# Patient Record
Sex: Male | Born: 1990 | Race: Black or African American | Hispanic: No | Marital: Single | Smoking: Never smoker
Health system: Southern US, Community
[De-identification: ages and names within clinical notes are randomized; demographics above are authoritative.]

## PROBLEM LIST (undated history)

## (undated) ENCOUNTER — Ambulatory Visit (HOSPITAL_COMMUNITY): Admission: EM

---
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
## 2010-03-10 ENCOUNTER — Emergency Department (HOSPITAL_COMMUNITY): Admission: EM | Admit: 2010-03-10 | Discharge: 2010-03-11 | Payer: Self-pay | Admitting: Emergency Medicine

## 2010-08-21 ENCOUNTER — Emergency Department (HOSPITAL_COMMUNITY)
Admission: EM | Admit: 2010-08-21 | Discharge: 2010-08-21 | Payer: Self-pay | Source: Home / Self Care | Admitting: Family Medicine

## 2011-03-13 ENCOUNTER — Emergency Department (HOSPITAL_COMMUNITY): Payer: Self-pay

## 2011-03-13 ENCOUNTER — Emergency Department (HOSPITAL_COMMUNITY)
Admission: EM | Admit: 2011-03-13 | Discharge: 2011-03-13 | Disposition: A | Discharge status: Home / Self Care | Payer: Self-pay | Attending: Emergency Medicine | Admitting: Emergency Medicine

## 2011-03-13 DIAGNOSIS — Y9239 Other specified sports and athletic area as the place of occurrence of the external cause: Secondary | ICD-10-CM | POA: Insufficient documentation

## 2011-03-13 DIAGNOSIS — S6390XA Sprain of unspecified part of unspecified wrist and hand, initial encounter: Secondary | ICD-10-CM | POA: Insufficient documentation

## 2011-03-13 DIAGNOSIS — Y9367 Activity, basketball: Secondary | ICD-10-CM | POA: Insufficient documentation

## 2011-03-13 DIAGNOSIS — M79609 Pain in unspecified limb: Secondary | ICD-10-CM | POA: Insufficient documentation

## 2011-03-13 DIAGNOSIS — Y92838 Other recreation area as the place of occurrence of the external cause: Secondary | ICD-10-CM | POA: Insufficient documentation

## 2011-03-13 DIAGNOSIS — IMO0002 Reserved for concepts with insufficient information to code with codable children: Secondary | ICD-10-CM | POA: Insufficient documentation

## 2012-09-23 ENCOUNTER — Emergency Department (HOSPITAL_COMMUNITY)
Admission: EM | Admit: 2012-09-23 | Discharge: 2012-09-23 | Disposition: A | Discharge status: Home / Self Care | Payer: Self-pay | Attending: Emergency Medicine | Admitting: Emergency Medicine

## 2012-09-23 ENCOUNTER — Encounter (HOSPITAL_COMMUNITY): Payer: Self-pay | Admitting: Emergency Medicine

## 2012-09-23 DIAGNOSIS — S335XXA Sprain of ligaments of lumbar spine, initial encounter: Secondary | ICD-10-CM | POA: Insufficient documentation

## 2012-09-23 DIAGNOSIS — X500XXA Overexertion from strenuous movement or load, initial encounter: Secondary | ICD-10-CM | POA: Insufficient documentation

## 2012-09-23 DIAGNOSIS — Y9239 Other specified sports and athletic area as the place of occurrence of the external cause: Secondary | ICD-10-CM | POA: Insufficient documentation

## 2012-09-23 DIAGNOSIS — Y9367 Activity, basketball: Secondary | ICD-10-CM | POA: Insufficient documentation

## 2012-09-23 LAB — URINALYSIS, ROUTINE W REFLEX MICROSCOPIC
Glucose, UA: NEGATIVE mg/dL
Hgb urine dipstick: NEGATIVE
Ketones, ur: NEGATIVE mg/dL
Nitrite: NEGATIVE
Protein, ur: 30 mg/dL — AB
Specific Gravity, Urine: 1.025 (ref 1.005–1.030)
pH: 6 (ref 5.0–8.0)

## 2012-09-23 LAB — URINE MICROSCOPIC-ADD ON

## 2012-09-23 MED ORDER — HYDROCODONE-ACETAMINOPHEN 5-325 MG PO TABS: 1.0000 | ORAL_TABLET | ORAL | Status: DC | PRN

## 2012-09-23 MED ORDER — IBUPROFEN 800 MG PO TABS: 800.0000 mg | ORAL_TABLET | Freq: Three times a day (TID) | ORAL | Status: DC

## 2012-09-23 MED ORDER — CYCLOBENZAPRINE HCL 5 MG PO TABS: 5.0000 mg | ORAL_TABLET | Freq: Two times a day (BID) | ORAL | Status: DC | PRN

## 2012-09-23 MED ORDER — IBUPROFEN 400 MG PO TABS
800.0000 mg | ORAL_TABLET | Freq: Once | ORAL | Status: AC
  Administered 2012-09-23: 800 mg via ORAL
  Filled 2012-09-23: qty 2

## 2012-09-23 NOTE — ED Notes (Signed)
Pt c/o pain in lower back describes as sharp.  Onset 1 hr ago.

## 2012-09-23 NOTE — ED Provider Notes (Deleted)
History     CSN: [id number]  Arrival date & time 09/23/12  0054   First MD Initiated Contact with Patient 09/23/12 0143      Chief Complaint  Patient presents with  . Back Pain    (Consider location/radiation/quality/duration/timing/severity/associated sxs/prior treatment) HPI  Pt prsents to the ED with complaints of Low back pain that does not radiate and is sharp in nature. It started 1 hour ago while playing basketball. He was shooting when the pain started. It is new. He denies hearing a pop or being unable to walk afterwards. He said that once he slowed down and rested the discomfort increased. NO bowel or urine incontinence. No numbness or weakness.    History reviewed. No pertinent past medical history.  History reviewed. No pertinent past surgical history.  No family history on file.  History  Substance Use Topics  . Smoking status: Never Smoker   . Smokeless tobacco: Not on file  . Alcohol Use: No      Review of Systems  Review of Systems  Gen: no weight loss, fevers, chills, night sweats  Eyes: no discharge or drainage, no occular pain or visual changes  Nose: no epistaxis or rhinorrhea  Mouth: no dental pain, no sore throat  Neck: no neck pain  Lungs:No wheezing, coughing or hemoptysis CV: no chest pain, palpitations, dependent edema or orthopnea  Abd: no abdominal pain, nausea, vomiting  GU: no dysuria or gross hematuria  MSK:  Low back pain Neuro: no headache, no focal neurologic deficits  Skin: no abnormalities Psyche: negative.   Allergies  Review of patient's allergies indicates no known allergies.  Home Medications   Current Outpatient Rx  Name  Route  Sig  Dispense  Refill  . ibuprofen (ADVIL,MOTRIN) 200 MG tablet   Oral   Take 200 mg by mouth every 6 (six) hours as needed for pain.         . cyclobenzaprine (FLEXERIL) 5 MG tablet   Oral   Take 1 tablet (5 mg total) by mouth 2 (two) times daily as needed for muscle spasms.   12  tablet   0   . HYDROcodone-acetaminophen (NORCO/VICODIN) 5-325 MG per tablet   Oral   Take 1 tablet by mouth every 4 (four) hours as needed for pain.   6 tablet   0   . ibuprofen (ADVIL,MOTRIN) 800 MG tablet   Oral   Take 1 tablet (800 mg total) by mouth 3 (three) times daily.   21 tablet   0     BP 119/72  Pulse 77  Temp(Src) 98.5 F (36.9 C) (Oral)  Resp 17  SpO2 98%  Physical Exam  Nursing note and vitals reviewed. Constitutional: He appears well-developed and well-nourished. No distress.  HENT:  Head: Normocephalic and atraumatic.  Eyes: Pupils are equal, round, and reactive to light.  Neck: Normal range of motion. Neck supple.  Cardiovascular: Normal rate and regular rhythm.   Pulmonary/Chest: Effort normal.  Abdominal: Soft.  Musculoskeletal:   Equal strength to bilateral lower extremities. Neurosensory  function adequate to both legs. Skin color is normal. Skin is warm and moist. I see no step off deformity, no bony tenderness. Pt is able to ambulate without limp. Pain is relieved when sitting in certain positions. ROM is decreased due to pain. No crepitus, laceration, effusion, swelling.  Pulses are normal   Neurological: He is alert.  Skin: Skin is warm and dry.    ED Course  Procedures (including critical care  time)  Labs Reviewed  URINALYSIS, ROUTINE W REFLEX MICROSCOPIC - Abnormal; Notable for the following:    Protein, ur 30 (*)    All other components within normal limits  URINE MICROSCOPIC-ADD ON - Abnormal; Notable for the following:    Casts HYALINE CASTS (*)    Crystals CA OXALATE CRYSTALS (*)    All other components within normal limits   No results found.   1. Low back strain       MDM   Patient with back pain. No neurological deficits. Patient is ambulatory. No warning symptoms of back pain including: loss of bowel or bladder control, night sweats, waking from sleep with back pain, unexplained fevers or weight loss, h/o cancer, IVDU,  recent trauma. No concern for cauda equina, epidural abscess, or other serious cause of back pain. Conservative measures such as rest, ice/heat and pain medicine indicated with PCP follow-up if no improvement with conservative management.           Dorthula Matas, PA 09/26/12 2029

## 2012-09-23 NOTE — ED Provider Notes (Signed)
Medical screening examination/treatment/procedure(s) were performed by non-physician practitioner and as supervising physician I was immediately available for consultation/collaboration.  Avrum Kimball Lytle Michaels, MD 09/23/12 [phone]

## 2012-09-23 NOTE — ED Provider Notes (Signed)
History     CSN: [id number]  Arrival date & time 09/23/12  0054   First MD Initiated Contact with Patient 09/23/12 0143      Chief Complaint  Patient presents with  . Back Pain    (Consider location/radiation/quality/duration/timing/severity/associated sxs/prior treatment) HPI  H. presents to the emergency department with complaints of right lower back pain. He does not have any radiation of this pain. The pain started 4 hours ago while he was playing basketball. He says that he was taken a shot when he immediately felt pain in his right low back. He denies IV drug use, loss of bowel or bladder function, numbness or weakness on that side or anywhere else. He also denies dysuria or midline back pain. Patient denies history of back pain. nad vss    History reviewed. No pertinent past medical history.  History reviewed. No pertinent past surgical history.  No family history on file.  History  Substance Use Topics  . Smoking status: Never Smoker   . Smokeless tobacco: Not on file  . Alcohol Use: No      Review of Systems  Review of Systems  Gen: no weight loss, fevers, chills, night sweats  Eyes: no discharge or drainage, no occular pain or visual changes  Nose: no epistaxis or rhinorrhea  Mouth: no dental pain, no sore throat  Neck: no neck pain  Lungs:No wheezing, coughing or hemoptysis CV: no chest pain, palpitations, dependent edema or orthopnea  Abd: no abdominal pain, nausea, vomiting  GU: no dysuria or gross hematuria  MSK: low back pain Neuro: no headache, no focal neurologic deficits  Skin: no abnormalities Psyche: negative.   Allergies  Review of patient's allergies indicates no known allergies.  Home Medications   Current Outpatient Rx  Name  Route  Sig  Dispense  Refill  . ibuprofen (ADVIL,MOTRIN) 200 MG tablet   Oral   Take 200 mg by mouth every 6 (six) hours as needed for pain.           BP 119/72  Pulse 77  Temp(Src) 98.5 F (36.9 C)  (Oral)  Resp 17  SpO2 98%  Physical Exam  Nursing note and vitals reviewed. Constitutional: He appears well-developed and well-nourished. No distress.  HENT:  Head: Normocephalic and atraumatic.  Eyes: Pupils are equal, round, and reactive to light.  Neck: Normal range of motion. Neck supple.  Cardiovascular: Normal rate and regular rhythm.   Pulmonary/Chest: Effort normal.  Abdominal: Soft.  Musculoskeletal:       Lumbar back: He exhibits tenderness, pain and spasm. He exhibits normal range of motion, no bony tenderness, no swelling, no edema, no deformity, no laceration and normal pulse.       Back:   Equal strength to bilateral lower extremities. Neurosensory  function adequate to both legs. Skin color is normal. Skin is warm and moist. I see no step off deformity, no bony tenderness. Pt is able to ambulate without limp. Pain is relieved when sitting in certain positions. ROM is decreased due to pain. No crepitus, laceration, effusion, swelling.  Pulses are normal   Neurological: He is alert.  Skin: Skin is warm and dry.    ED Course  Procedures (including critical care time)  Labs Reviewed  URINALYSIS, ROUTINE W REFLEX MICROSCOPIC - Abnormal; Notable for the following:    Protein, ur 30 (*)    All other components within normal limits  URINE MICROSCOPIC-ADD ON - Abnormal; Notable for the following:    Casts  HYALINE CASTS (*)    Crystals CA OXALATE CRYSTALS (*)    All other components within normal limits   No results found.   1. Low back strain       MDM  Patient with back pain. No neurological deficits. Patient is ambulatory. No warning symptoms of back pain including: loss of bowel or bladder control, night sweats, waking from sleep with back pain, unexplained fevers or weight loss, h/o cancer, IVDU, recent trauma. No concern for cauda equina, epidural abscess, or other serious cause of back pain. Conservative measures such as rest, ice/heat and pain medicine  indicated with PCP follow-up if no improvement with conservative management.           Dorthula Matas, PA 09/23/12 0236

## 2014-10-23 ENCOUNTER — Emergency Department (HOSPITAL_COMMUNITY)
Admission: EM | Admit: 2014-10-23 | Discharge: 2014-10-23 | Disposition: A | Discharge status: Home / Self Care | Payer: Self-pay | Attending: Emergency Medicine | Admitting: Emergency Medicine

## 2014-10-23 ENCOUNTER — Emergency Department (HOSPITAL_COMMUNITY): Payer: Self-pay

## 2014-10-23 ENCOUNTER — Encounter (HOSPITAL_COMMUNITY): Payer: Self-pay | Admitting: *Deleted

## 2014-10-23 DIAGNOSIS — R079 Chest pain, unspecified: Secondary | ICD-10-CM | POA: Insufficient documentation

## 2014-10-23 DIAGNOSIS — Z791 Long term (current) use of non-steroidal anti-inflammatories (NSAID): Secondary | ICD-10-CM | POA: Insufficient documentation

## 2014-10-23 DIAGNOSIS — R001 Bradycardia, unspecified: Secondary | ICD-10-CM | POA: Insufficient documentation

## 2014-10-23 LAB — CBC
HEMATOCRIT: 40.4 % (ref 39.0–52.0)
Hemoglobin: 13.4 g/dL (ref 13.0–17.0)
MCH: 28.9 pg (ref 26.0–34.0)
MCHC: 33.2 g/dL (ref 30.0–36.0)
MCV: 87.1 fL (ref 78.0–100.0)
PLATELETS: 202 10*3/uL (ref 150–400)
RBC: 4.64 MIL/uL (ref 4.22–5.81)
RDW: 13.1 % (ref 11.5–15.5)
WBC: 6.2 10*3/uL (ref 4.0–10.5)

## 2014-10-23 LAB — I-STAT TROPONIN, ED: Troponin i, poc: 0 ng/mL (ref 0.00–0.08)

## 2014-10-23 LAB — BASIC METABOLIC PANEL
ANION GAP: 5 (ref 5–15)
BUN: 16 mg/dL (ref 6–23)
CO2: 29 mmol/L (ref 19–32)
Calcium: 9.4 mg/dL (ref 8.4–10.5)
Chloride: 104 mmol/L (ref 96–112)
Creatinine, Ser: 1.2 mg/dL (ref 0.50–1.35)
GFR, EST NON AFRICAN AMERICAN: 83 mL/min — AB (ref >90)
GLUCOSE: 80 mg/dL (ref 70–99)
POTASSIUM: 4.5 mmol/L (ref 3.5–5.1)
SODIUM: 138 mmol/L (ref 135–145)

## 2014-10-23 MED ORDER — NAPROXEN 375 MG PO TABS: 375.0000 mg | ORAL_TABLET | Freq: Two times a day (BID) | ORAL | Status: DC

## 2014-10-23 MED ORDER — KETOROLAC TROMETHAMINE 30 MG/ML IJ SOLN
30.0000 mg | Freq: Once | INTRAMUSCULAR | Status: AC
  Administered 2014-10-23: 30 mg via INTRAMUSCULAR
  Filled 2014-10-23: qty 1

## 2014-10-23 NOTE — ED Provider Notes (Signed)
CSN: [id number]     Arrival date & time 10/23/14  2006 History   First MD Initiated Contact with Patient 10/23/14 2122     Chief Complaint  Patient presents with  . Chest Pain     (Consider location/radiation/quality/duration/timing/severity/associated sxs/prior Treatment) HPI Comments: Patient presents to the emergency department with chief complaint of chest pain. He states that he has had chest pain for the past 2 weeks. He states that it is constant. Patient states that it is worsened with certain positions. He reports that it increases with heavy lifting. He denies any associated shortness of breath, diaphoresis, or radiating symptoms. Denies any family history of sudden cardiac death. He does report extensive heart disease in his grandparents. He denies any new mechanism of injury.  The history is provided by the patient. No language interpreter was used.    History reviewed. No pertinent past medical history. History reviewed. No pertinent past surgical history. History reviewed. No pertinent family history. History  Substance Use Topics  . Smoking status: Never Smoker   . Smokeless tobacco: Not on file  . Alcohol Use: No    Review of Systems  Constitutional: Negative for fever and chills.  Respiratory: Negative for shortness of breath.   Cardiovascular: Positive for chest pain.  Gastrointestinal: Negative for nausea, vomiting, diarrhea and constipation.  Genitourinary: Negative for dysuria.  All other systems reviewed and are negative.     Allergies  Review of patient's allergies indicates no known allergies.  Home Medications   Prior to Admission medications   Medication Sig Start Date End Date Taking? Authorizing Provider  cyclobenzaprine (FLEXERIL) 5 MG tablet Take 1 tablet (5 mg total) by mouth 2 (two) times daily as needed for muscle spasms. Patient not taking: Reported on 10/23/2014 09/23/12   Marlon Peliffany Greene, PA-C  HYDROcodone-acetaminophen (NORCO/VICODIN)  5-325 MG per tablet Take 1 tablet by mouth every 4 (four) hours as needed for pain. Patient not taking: Reported on 10/23/2014 09/23/12   Marlon Peliffany Greene, PA-C  ibuprofen (ADVIL,MOTRIN) 800 MG tablet Take 1 tablet (800 mg total) by mouth 3 (three) times daily. Patient not taking: Reported on 10/23/2014 09/23/12   Marlon Peliffany Greene, PA-C   BP 116/56 mmHg  Pulse 58  Temp(Src) 98.7 F (37.1 C) (Oral)  Resp 20  Ht 5\' 9"  (1.753 m)  Wt 153 lb (69.4 kg)  BMI 22.58 kg/m2  SpO2 100% Physical Exam  Constitutional: He is oriented to person, place, and time. He appears well-developed and well-nourished.  HENT:  Head: Normocephalic and atraumatic.  Eyes: Conjunctivae and EOM are normal. Pupils are equal, round, and reactive to light. Right eye exhibits no discharge. Left eye exhibits no discharge. No scleral icterus.  Neck: Normal range of motion. Neck supple. No JVD present.  Cardiovascular: Regular rhythm and normal heart sounds.  Exam reveals no gallop and no friction rub.   No murmur heard. Mildly bradycardic  Pulmonary/Chest: Effort normal and breath sounds normal. No respiratory distress. He has no wheezes. He has no rales. He exhibits no tenderness.  Abdominal: Soft. He exhibits no distension and no mass. There is no tenderness. There is no rebound and no guarding.  Musculoskeletal: Normal range of motion. He exhibits no edema or tenderness.  Neurological: He is alert and oriented to person, place, and time.  Skin: Skin is warm and dry.  Psychiatric: He has a normal mood and affect. His behavior is normal. Judgment and thought content normal.  Nursing note and vitals reviewed.   ED Course  Procedures (including critical care time) Labs Review Labs Reviewed  CBC  BASIC METABOLIC PANEL  I-STAT TROPOININ, ED    Imaging Review Dg Chest 2 View  10/23/2014   CLINICAL DATA:  Subacute onset of chest pain, moving to the left posterior chest when lifting objects. Initial encounter.  EXAM: CHEST  2  VIEW  COMPARISON:  Thoracic spine radiographs performed 02/06/2009  FINDINGS: The lungs are well-aerated and clear. There is no evidence of focal opacification, pleural effusion or pneumothorax.  The heart is normal in size; the mediastinal contour is within normal limits. No acute osseous abnormalities are seen.  IMPRESSION: No acute cardiopulmonary process seen. No displaced rib fractures identified.   Electronically Signed   By: Roanna Raider M.D.   On: 10/23/2014 20:45     EKG Interpretation None     ED ECG REPORT  I personally interpreted this EKG   Date: 10/23/2014   Rate: 58  Rhythm: sinus bradycardia  QRS Axis: normal  Intervals: normal  ST/T Wave abnormalities: normal  Conduction Disutrbances:none  Narrative Interpretation:   Old EKG Reviewed: none available   MDM   Final diagnoses:  Chest pain, unspecified chest pain type    Patient with constant chest pain 2 weeks. Heart score is 1 for significant family history.  PERC negative.  Troponin is negative, EKG is unremarkable for ischemic changes.  Highly doubt ACS or PE.  CXR is negative for pneumothorax, negative widened mediastinum, no rib fractures, or pneumonia.  I suspect that the patient can be safely discharged to home and may continue workup on outpatient basis.    Roxy Horseman, PA-C 10/23/14 2207  Rolland Porter, MD 10/23/14 2233

## 2014-10-23 NOTE — Discharge Instructions (Signed)
Chest Pain (Nonspecific) °It is often hard to give a diagnosis for the cause of chest pain. There is always a chance that your pain could be related to something serious, such as a heart attack or a blood clot in the lungs. You need to follow up with your doctor. °HOME CARE °· If antibiotic medicine was given, take it as directed by your doctor. Finish the medicine even if you start to feel better. °· For the next few days, avoid activities that bring on chest pain. Continue physical activities as told by your doctor. °· Do not use any tobacco products. This includes cigarettes, chewing tobacco, and e-cigarettes. °· Avoid drinking alcohol. °· Only take medicine as told by your doctor. °· Follow your doctor's suggestions for more testing if your chest pain does not go away. °· Keep all doctor visits you made. °GET HELP IF: °· Your chest pain does not go away, even after treatment. °· You have a rash with blisters on your chest. °· You have a fever. °GET HELP RIGHT AWAY IF:  °· You have more pain or pain that spreads to your arm, neck, jaw, back, or belly (abdomen). °· You have shortness of breath. °· You cough more than usual or cough up blood. °· You have very bad back or belly pain. °· You feel sick to your stomach (nauseous) or throw up (vomit). °· You have very bad weakness. °· You pass out (faint). °· You have chills. °This is an emergency. Do not wait to see if the problems will go away. Call your local emergency services (911 in [place] Do not drive yourself to the hospital. °MAKE SURE YOU:  °· Understand these instructions. °· Will watch your condition. °· Will get help right away if you are not doing well or get worse. °Document Released: 01/08/2008 Document Revised: 07/27/2013 Document Reviewed: 01/08/2008 °[employer]® Patient Information ©2015 [employer]. This information is not intended to replace advice given to you by your health care provider. Make sure you discuss any questions you have with your  health care provider. ° °Chest Wall Pain °Chest wall pain is pain in or around the bones and muscles of your chest. It may take up to 6 weeks to get better. It may take longer if you must stay physically active in your work and activities.  °CAUSES  °Chest wall pain may happen on its own. However, it may be caused by: °· A viral illness like the flu. °· Injury. °· Coughing. °· Exercise. °· Arthritis. °· Fibromyalgia. °· Shingles. °HOME CARE INSTRUCTIONS  °· Avoid overtiring physical activity. Try not to strain or perform activities that cause pain. This includes any activities using your chest or your abdominal and side muscles, especially if heavy weights are used. °· Put ice on the sore area. °¨ Put ice in a plastic bag. °¨ Place a towel between your skin and the bag. °¨ Leave the ice on for 15-20 minutes per hour while awake for the first 2 days. °· Only take over-the-counter or prescription medicines for pain, discomfort, or fever as directed by your caregiver. °SEEK IMMEDIATE MEDICAL CARE IF:  °· Your pain increases, or you are very uncomfortable. °· You have a fever. °· Your chest pain becomes worse. °· You have new, unexplained symptoms. °· You have nausea or vomiting. °· You feel sweaty or lightheaded. °· You have a cough with phlegm (sputum), or you cough up blood. °MAKE SURE YOU:  °· Understand these instructions. °· Will watch your   condition. °· Will get help right away if you are not doing well or get worse. °Document Released: 07/22/2005 Document Revised: 10/14/2011 Document Reviewed: 03/18/2011 °[employer]® Patient Information ©2015 [employer]. This information is not intended to replace advice given to you by your health care provider. Make sure you discuss any questions you have with your health care provider. ° °

## 2014-10-23 NOTE — ED Notes (Signed)
Pt in c/o chest pain for the last two weeks, pressure is constant and certain positions and lifting things increase pain, denies other symptoms, no distress noted

## 2016-03-06 ENCOUNTER — Encounter (HOSPITAL_COMMUNITY): Payer: Self-pay | Admitting: Emergency Medicine

## 2016-03-06 ENCOUNTER — Emergency Department (HOSPITAL_COMMUNITY)

## 2016-03-06 ENCOUNTER — Emergency Department (HOSPITAL_COMMUNITY)
Admission: EM | Admit: 2016-03-06 | Discharge: 2016-03-06 | Disposition: A | Discharge status: Home / Self Care | Attending: Emergency Medicine | Admitting: Emergency Medicine

## 2016-03-06 DIAGNOSIS — Z791 Long term (current) use of non-steroidal anti-inflammatories (NSAID): Secondary | ICD-10-CM | POA: Diagnosis not present

## 2016-03-06 DIAGNOSIS — R590 Localized enlarged lymph nodes: Secondary | ICD-10-CM | POA: Insufficient documentation

## 2016-03-06 DIAGNOSIS — K219 Gastro-esophageal reflux disease without esophagitis: Secondary | ICD-10-CM

## 2016-03-06 DIAGNOSIS — R0789 Other chest pain: Secondary | ICD-10-CM | POA: Diagnosis present

## 2016-03-06 LAB — BASIC METABOLIC PANEL
Anion gap: 7 (ref 5–15)
BUN: 15 mg/dL (ref 6–20)
CHLORIDE: 101 mmol/L (ref 101–111)
CO2: 28 mmol/L (ref 22–32)
Calcium: 9.7 mg/dL (ref 8.9–10.3)
Creatinine, Ser: 1.32 mg/dL — ABNORMAL HIGH (ref 0.61–1.24)
GFR calc Af Amer: >60 mL/min (ref >60)
GFR calc non Af Amer: >60 mL/min (ref >60)
GLUCOSE: 90 mg/dL (ref 65–99)
POTASSIUM: 3.9 mmol/L (ref 3.5–5.1)
SODIUM: 136 mmol/L (ref 135–145)

## 2016-03-06 LAB — CBC
HEMATOCRIT: 42.9 % (ref 39.0–52.0)
Hemoglobin: 14.2 g/dL (ref 13.0–17.0)
MCH: 28.2 pg (ref 26.0–34.0)
MCHC: 33.1 g/dL (ref 30.0–36.0)
MCV: 85.3 fL (ref 78.0–100.0)
Platelets: 201 10*3/uL (ref 150–400)
RBC: 5.03 MIL/uL (ref 4.22–5.81)
RDW: 12.9 % (ref 11.5–15.5)
WBC: 6.4 10*3/uL (ref 4.0–10.5)

## 2016-03-06 LAB — I-STAT TROPONIN, ED: Troponin i, poc: 0 ng/mL (ref 0.00–0.08)

## 2016-03-06 MED ORDER — GI COCKTAIL ~~LOC~~
30.0000 mL | Freq: Once | ORAL | Status: AC
  Administered 2016-03-06: 30 mL via ORAL
  Filled 2016-03-06: qty 30

## 2016-03-06 MED ORDER — KETOROLAC TROMETHAMINE 60 MG/2ML IM SOLN
60.0000 mg | Freq: Once | INTRAMUSCULAR | Status: AC
  Administered 2016-03-06: 60 mg via INTRAMUSCULAR
  Filled 2016-03-06: qty 2

## 2016-03-06 MED ORDER — FAMOTIDINE 20 MG PO TABS: 20.0000 mg | ORAL_TABLET | Freq: Two times a day (BID) | ORAL | 0 refills | Status: DC

## 2016-03-06 NOTE — ED Triage Notes (Signed)
Pt states that he has had chest pain x 2 days. States that it is exacerbated by any movement. Denies hx of same. Tried otc acid reducers w/o relief. Alert and oriented.

## 2016-03-06 NOTE — ED Notes (Signed)
Provider d/c iv access

## 2016-03-06 NOTE — ED Provider Notes (Signed)
[facility]-EMERGENCY DEPT Provider Note   CSN: [id number] Arrival date & time: 03/06/16  0212  First Provider Contact:  None    By signing my name below, I, Emmanuella Mensah, attest that this documentation has been prepared under the direction and in the presence of Ingeborg Fite, MD. Electronically Signed: Angelene Giovanni, ED Scribe. 03/06/16. 3:38 AM.    History   Chief Complaint Chief Complaint  Patient presents with  . Chest Pain   HPI Comments: Justin Dougherty is a 25 y.o. male who presents to the Emergency Department complaining of gradually worsening constant pressure chest pain onset 2 days ago. He notes that his pain is worse at night as he lays down or when he bends over. He states that he has been drinking protein shakes lately. No alleviating factors noted. Pt has not tried any medications PTA. He reports that he smoked marijuana yesterday. No fever, chills, SOB, n/v, or leg swelling.  Pt also c/o an area of swelling to his left groin onset 2 weeks ago. He reports that he was seen for these symptoms and is currently on a 10 day course of antiobiotics. No other complaints at this time.    The history is provided by the patient. No language interpreter was used.    History reviewed. No pertinent past medical history.  There are no active problems to display for this patient.   History reviewed. No pertinent surgical history.     Home Medications    Prior to Admission medications   Medication Sig Start Date End Date Taking? Authorizing Provider  cyclobenzaprine (FLEXERIL) 5 MG tablet Take 1 tablet (5 mg total) by mouth 2 (two) times daily as needed for muscle spasms. Patient not taking: Reported on 10/23/2014 09/23/12   Marlon Pel, PA-C  HYDROcodone-acetaminophen (NORCO/VICODIN) 5-325 MG per tablet Take 1 tablet by mouth every 4 (four) hours as needed for pain. Patient not taking: Reported on 10/23/2014 09/23/12   Marlon Pel, PA-C  naproxen (NAPROSYN) 375 MG  tablet Take 1 tablet (375 mg total) by mouth 2 (two) times daily. 10/23/14   Roxy Horseman, PA-C    Family History History reviewed. No pertinent family history.  Social History Social History  Substance Use Topics  . Smoking status: Never Smoker  . Smokeless tobacco: Not on file  . Alcohol use No     Allergies   Review of patient's allergies indicates no known allergies.   Review of Systems Review of Systems  Constitutional: Negative for chills and fever.  Respiratory: Negative for shortness of breath.   Cardiovascular: Positive for chest pain.  Gastrointestinal: Negative for abdominal pain, nausea and vomiting.  All other systems reviewed and are negative.    Physical Exam Updated Vital Signs BP 142/88 (BP Location: Left Arm)   Pulse (!) 52   Temp 98.8 F (37.1 C) (Oral)   Resp 18   SpO2 100%   Physical Exam  Constitutional: He is oriented to person, place, and time. He appears well-developed and well-nourished. No distress.  HENT:  Head: Normocephalic and atraumatic.  Eyes: Conjunctivae and EOM are normal.  Neck: Neck supple. No tracheal deviation present.  Cardiovascular: Normal rate.   Pulmonary/Chest: Effort normal. No respiratory distress. He has no wheezes. He has no rales. He exhibits no tenderness.  Abdominal: He exhibits no distension and no mass. There is no tenderness. There is no rebound and no guarding.  Musculoskeletal: Normal range of motion.  No chords  Lymphadenopathy:  Head (right side): No submental, no submandibular, no tonsillar and no preauricular adenopathy present.       Head (left side): No submental, no submandibular, no tonsillar and no posterior auricular adenopathy present.       Right cervical: No superficial cervical, no deep cervical and no posterior cervical adenopathy present.      Left cervical: No superficial cervical and no deep cervical adenopathy present.    He has no axillary adenopathy.       Right: No inguinal,  no supraclavicular and no epitrochlear adenopathy present.       Left: Inguinal adenopathy present. No supraclavicular and no epitrochlear adenopathy present.  Isolated 1 cm freely mobile left groin node  Neurological: He is alert and oriented to person, place, and time.  Skin: Skin is warm and dry.  Psychiatric: He has a normal mood and affect. His behavior is normal.  Nursing note and vitals reviewed.    ED Treatments / Results  DIAGNOSTIC STUDIES: Oxygen Saturation is 100% on RA, normal by my interpretation.    COORDINATION OF CARE: 3:32 AM- Pt advised of plan for treatment and pt agrees. Pt informed of his results. Will treat for GERD.    Labs (all labs ordered are listed, but only abnormal results are displayed) Labs Reviewed  BASIC METABOLIC PANEL - Abnormal; Notable for the following:       Result Value   Creatinine, Ser 1.32 (*)    All other components within normal limits  CBC  I-STAT TROPOININ, ED    EKG  EKG Interpretation  Date/Time:  Wednesday March 06 2016 02:19:40 EDT Ventricular Rate:  47 PR Interval:    QRS Duration: 91 QT Interval:  441 QTC Calculation: 390 R Axis:   78 Text Interpretation:  Sinus bradycardia LVH by voltage ST elev, probable normal early repol pattern Confirmed by [facility]  MD, Topanga Alvelo ([id number]) on 03/06/2016 2:36:07 AM       Radiology Dg Chest 2 View  Result Date: 03/06/2016 CLINICAL DATA:  25 year old male with chest pain EXAM: CHEST  2 VIEW COMPARISON:  Chest radiograph dated 10/23/2014 FINDINGS: The heart size and mediastinal contours are within normal limits. Both lungs are clear. The visualized skeletal structures are unremarkable. IMPRESSION: No active cardiopulmonary disease. Electronically Signed   By: Elgie Collard M.D.   On: 03/06/2016 02:38    Procedures Procedures (including critical care time)  Medications Ordered in ED Medications - No data to display   Initial Impression / Assessment and Plan / ED Course    Nycole Kawahara, MD has reviewed the triage vital signs and the nursing notes.  Pertinent labs & imaging results that were available during my care of the patient were reviewed by me and considered in my medical decision making (see chart for details).  Clinical Course   Vitals:   03/06/16 0220  BP: 142/88  Pulse: (!) 52  Resp: 18  Temp: 98.8 F (37.1 C)   Results for orders placed or performed during the hospital encounter of 03/06/16  Basic metabolic panel  Result Value Ref Range   Sodium 136 135 - 145 mmol/L   Potassium 3.9 3.5 - 5.1 mmol/L   Chloride 101 101 - 111 mmol/L   CO2 28 22 - 32 mmol/L   Glucose, Bld 90 65 - 99 mg/dL   BUN 15 6 - 20 mg/dL   Creatinine, Ser 7.82 (H) 0.61 - 1.24 mg/dL   Calcium 9.7 8.9 - 95.6 mg/dL   GFR calc non  Af Amer >60 >60 mL/min   GFR calc Af Amer >60 >60 mL/min   Anion gap 7 5 - 15  CBC  Result Value Ref Range   WBC 6.4 4.0 - 10.5 K/uL   RBC 5.03 4.22 - 5.81 MIL/uL   Hemoglobin 14.2 13.0 - 17.0 g/dL   HCT 75.6 43.3 - 29.5 %   MCV 85.3 78.0 - 100.0 fL   MCH 28.2 26.0 - 34.0 pg   MCHC 33.1 30.0 - 36.0 g/dL   RDW 18.8 41.6 - 60.6 %   Platelets 201 150 - 400 K/uL  I-stat troponin, ED  Result Value Ref Range   Troponin i, poc 0.00 0.00 - 0.08 ng/mL   Comment 3           Dg Chest 2 View  Result Date: 03/06/2016 CLINICAL DATA:  25 year old male with chest pain EXAM: CHEST  2 VIEW COMPARISON:  Chest radiograph dated 10/23/2014 FINDINGS: The heart size and mediastinal contours are within normal limits. Both lungs are clear. The visualized skeletal structures are unremarkable. IMPRESSION: No active cardiopulmonary disease. Electronically Signed   By: Elgie Collard M.D.   On: 03/06/2016 02:38    Vitals:   03/06/16 0220 03/06/16 0410  BP: 142/88 135/76  Pulse: (!) 52 (!) 51  Resp: 18 19  Temp: 98.8 F (37.1 C) 97.9 F (36.6 C)   Medications  gi cocktail (Maalox,Lidocaine,Donnatal) (30 mLs Oral Given 03/06/16 0409)  ketorolac  (TORADOL) injection 60 mg (60 mg Intramuscular Given 03/06/16 0404)    Final Clinical Impressions(s) / ED Diagnoses   Final diagnoses:  None    New Prescriptions New Prescriptions   No medications on file  I suspect this is GERD>  Will start a GERD friendly diet and PEPCID BID.  No more marijuana.  As for the groin lymph node it is isolated.  1 cm freely mobile and the patient is in the middle of a course of antibiotics already.  He shaves in that area and has been instructed to stop shaving.  Follow up with his PMD upon completing the course for ongoing treatment.  Patient verbalizes understanding and agrees to follow up.  Will start a PPI and have patient follow up with his PMD. All questions answered to patient's satisfaction. Based on history and exam patient has been appropriately medically screened and emergency conditions excluded. Patient is stable for discharge at this time. Follow up with your PMDfor recheck in 2 daysand strict return precautions given.  I personally performed the services described in this documentation, which was scribed in my presence. The recorded information has been reviewed and is accurate.      Cy Blamer, MD 03/06/16 [phone]

## 2018-06-12 ENCOUNTER — Ambulatory Visit (HOSPITAL_COMMUNITY)
Admission: EM | Admit: 2018-06-12 | Discharge: 2018-06-12 | Disposition: A | Discharge status: Home / Self Care | Attending: Internal Medicine | Admitting: Internal Medicine

## 2018-06-12 ENCOUNTER — Encounter (HOSPITAL_COMMUNITY): Payer: Self-pay | Admitting: Emergency Medicine

## 2018-06-12 DIAGNOSIS — Z7251 High risk heterosexual behavior: Secondary | ICD-10-CM

## 2018-06-12 DIAGNOSIS — L02214 Cutaneous abscess of groin: Secondary | ICD-10-CM | POA: Insufficient documentation

## 2018-06-12 MED ORDER — DOXYCYCLINE HYCLATE 100 MG PO CAPS: 100.0000 mg | ORAL_CAPSULE | Freq: Two times a day (BID) | ORAL | 0 refills | Status: AC

## 2018-06-12 NOTE — Discharge Instructions (Signed)
Urine cytology obtained Apply warm compresses 3-4x daily for 10-15 minutes Wash site daily with warm water and mild soap Keep covered to avoid friction Take antibiotic as prescribed and to completion Follow up here or with PCP  if symptoms persists Return or go to the ED if you have any new or worsening symptoms such as fever, chills, nausea, vomiting, abdominal pain, worsening pain or increased redness or swelling, etc..Marland Kitchen

## 2018-06-12 NOTE — ED Triage Notes (Signed)
Patient believes his lymph node in right groin is swollen and is painful

## 2018-06-12 NOTE — ED Provider Notes (Addendum)
[facility]   [id number] 06/12/18 Arrival Time: 1028   CC: ABSCESS  SUBJECTIVE:  Justin Dougherty is a 27 y.o. male who presents with a possible abscess of his right groin. Onset abrupt, approximately 3 days ago. Reports recent shaving and unprotected sex around the time of symptoms.  Painful to the touch.  Has not tried OTC medications.  Was seen at another UC and treated with antibiotic with relief.  Denies fever, chills, nausea, vomiting, abdominal or pelvic pain, changes in urinary or bowel habits, discharge from penis, testicular pain or swelling.    ROS: As per HPI.  History reviewed. No pertinent past medical history. History reviewed. No pertinent surgical history. No Known Allergies No current facility-administered medications on file prior to encounter.    No current outpatient medications on file prior to encounter.   Social History   Socioeconomic History  . Marital status: Married    Spouse name: Not on file  . Number of children: Not on file  . Years of education: Not on file  . Highest education level: Not on file  Occupational History  . Not on file  Social Needs  . Financial resource strain: Not on file  . Food insecurity:    Worry: Not on file    Inability: Not on file  . Transportation needs:    Medical: Not on file    Non-medical: Not on file  Tobacco Use  . Smoking status: Never Smoker  Substance and Sexual Activity  . Alcohol use: No  . Drug use: No  . Sexual activity: Not on file  Lifestyle  . Physical activity:    Days per week: Not on file    Minutes per session: Not on file  . Stress: Not on file  Relationships  . Social connections:    Talks on phone: Not on file    Gets together: Not on file    Attends religious service: Not on file    Active member of club or organization: Not on file    Attends meetings of clubs or organizations: Not on file    Relationship status: Not on file  . Intimate partner violence:    Fear of  current or ex partner: Not on file    Emotionally abused: Not on file    Physically abused: Not on file    Forced sexual activity: Not on file  Other Topics Concern  . Not on file  Social History Narrative  . Not on file   History reviewed. No pertinent family history.  OBJECTIVE:  Vitals:   06/12/18 1136  BP: 132/68  Pulse: 63  Resp: 18  Temp: 98.2 F (36.8 C)  SpO2: 100%     General appearance: alert; no distress HENT: NCAT; PERRL, EOMI grossly; nares patent; mouth and dentition intact CV: RRR without murmur, gallop or rub Lungs: CTAB; normal respiratory effort Abdomen: soft, nondistended; nontender to palpation; normal active BS; no guarding Skin: Declines chaperone: 4 x 2 cm area of induration of his right groin with mild overlying erythema; tender to touch; no active drainage; inguinal LAD present Psychological: alert and cooperative; normal mood and affect  ASSESSMENT & PLAN:  1. Abscess of right groin   2. Unprotected sex    Also considering lymphogranuloma venereum.    Meds ordered this encounter  Medications  . doxycycline (VIBRAMYCIN) 100 MG capsule    Sig: Take 1 capsule (100 mg total) by mouth 2 (two) times daily.    Dispense:  20 capsule    Refill:  0    Order Specific Question:   Supervising Provider    Answer:   Isa Rankin [[id number]]   Urine cytology obtained Apply warm compresses 3-4x daily for 10-15 minutes Wash site daily with warm water and mild soap Keep covered to avoid friction Take antibiotic as prescribed and to completion Follow up here or with PCP  if symptoms persists Return or go to the ED if you have any new or worsening symptoms such as fever, chills, nausea, vomiting, abdominal pain, worsening pain or increased redness or swelling, etc...  Instructed patient to follow up in 24-48 hours if not having significant improvement with antibiotic.    Reviewed expectations re: course of current medical issues. Questions  answered. Outlined signs and symptoms indicating need for more acute intervention. Patient verbalized understanding. After Visit Summary given.    Rennis Harding, PA-C 06/12/18 1251

## 2018-06-15 LAB — URINE CYTOLOGY ANCILLARY ONLY
Chlamydia: NEGATIVE
Neisseria Gonorrhea: NEGATIVE
Trichomonas: NEGATIVE

## 2018-06-16 LAB — URINE CYTOLOGY ANCILLARY ONLY: Candida vaginitis: NEGATIVE

## 2019-03-26 ENCOUNTER — Ambulatory Visit (HOSPITAL_COMMUNITY)
Admission: EM | Admit: 2019-03-26 | Discharge: 2019-03-26 | Disposition: A | Discharge status: Home / Self Care | Payer: HRSA Program | Attending: Family Medicine | Admitting: Family Medicine

## 2019-03-26 ENCOUNTER — Other Ambulatory Visit: Payer: Self-pay

## 2019-03-26 ENCOUNTER — Encounter (HOSPITAL_COMMUNITY): Payer: Self-pay

## 2019-03-26 DIAGNOSIS — Z20828 Contact with and (suspected) exposure to other viral communicable diseases: Secondary | ICD-10-CM

## 2019-03-26 DIAGNOSIS — Z20822 Contact with and (suspected) exposure to covid-19: Secondary | ICD-10-CM

## 2019-03-26 NOTE — ED Provider Notes (Signed)
[facility]    CSN: [id number] Arrival date & time: 03/26/19  1534      History   Chief Complaint Chief Complaint  Patient presents with  . Exposure to Covid 19    HPI Justin Dougherty is a 28 y.o. male.   HPI  Patient works in [employer].  1 of the people in his work group tested positive for coronavirus of the whole group is being required to go get testing.  He can go back to work until his test result is negative.  He feels well.  No cough cold runny nose or sore throat.  No shortness of breath.  No fever or chills.  No body aches  History reviewed. No pertinent past medical history.  There are no active problems to display for this patient.   History reviewed. No pertinent surgical history.     Home Medications    Prior to Admission medications   Medication Sig Start Date End Date Taking? Authorizing Provider  doxycycline (VIBRAMYCIN) 100 MG capsule Take 1 capsule (100 mg total) by mouth 2 (two) times daily. 06/12/18   Rennis HardingWurst, Brittany, PA-C    Family History History reviewed. No pertinent family history.  Social History Social History   Tobacco Use  . Smoking status: Never Smoker  . Smokeless tobacco: Never Used  Substance Use Topics  . Alcohol use: No  . Drug use: No     Allergies   Patient has no known allergies.   Review of Systems Review of Systems  Constitutional: Negative for chills and fever.  HENT: Negative for ear pain and sore throat.   Eyes: Negative for pain and visual disturbance.  Respiratory: Negative for cough and shortness of breath.   Cardiovascular: Negative for chest pain and palpitations.  Gastrointestinal: Negative for abdominal pain and vomiting.  Genitourinary: Negative for dysuria and hematuria.  Musculoskeletal: Negative for arthralgias and back pain.  Skin: Negative for color change and rash.  Neurological: Negative for seizures and syncope.  All other systems reviewed and are negative.    Physical  Exam Triage Vital Signs ED Triage Vitals  Enc Vitals Group     BP 03/26/19 1550 108/65     Pulse Rate 03/26/19 1550 60     Resp 03/26/19 1550 18     Temp 03/26/19 1550 98.7 F (37.1 C)     Temp Source 03/26/19 1550 Oral     SpO2 03/26/19 1550 99 %     Weight --      Height --      Head Circumference --      Peak Flow --      Pain Score 03/26/19 1551 0     Pain Loc --      Pain Edu? --      Excl. in GC? --    No data found.  Updated Vital Signs BP 108/65 (BP Location: Right Arm)   Pulse 60   Temp 98.7 F (37.1 C) (Oral)   Resp 18   SpO2 99%   Visual Acuity Right Eye Distance:   Left Eye Distance:   Bilateral Distance:    Right Eye Near:   Left Eye Near:    Bilateral Near:     Physical Exam Constitutional:      General: He is not in acute distress.    Appearance: He is well-developed.  HENT:     Head: Normocephalic and atraumatic.  Eyes:     Conjunctiva/sclera: Conjunctivae normal.  Pupils: Pupils are equal, round, and reactive to light.  Neck:     Musculoskeletal: Normal range of motion.  Cardiovascular:     Rate and Rhythm: Normal rate and regular rhythm.     Heart sounds: Normal heart sounds.  Pulmonary:     Effort: Pulmonary effort is normal. No respiratory distress.     Breath sounds: Normal breath sounds.  Abdominal:     General: There is no distension.     Palpations: Abdomen is soft.  Musculoskeletal: Normal range of motion.  Skin:    General: Skin is warm and dry.  Neurological:     Mental Status: He is alert.      UC Treatments / Results  Labs (all labs ordered are listed, but only abnormal results are displayed) Labs Reviewed  NOVEL CORONAVIRUS, NAA (HOSPITAL ORDER, SEND-OUT TO REF LAB)    EKG   Radiology No results found.  Procedures Procedures (including critical care time)  Medications Ordered in UC Medications - No data to display  Initial Impression / Assessment and Plan / UC Course  I have reviewed the triage  vital signs and the nursing notes.  Pertinent labs & imaging results that were available during my care of the patient were reviewed by me and considered in my medical decision making (see chart for details).     Reviewed FAQ about coronavirus.  Stressed the importance of quarantine until this result is available Final Clinical Impressions(s) / UC Diagnoses   Final diagnoses:  Exposure to Covid-19 Virus     Discharge Instructions     Take Tylenol for any pain and fever Take over-the-counter cold medicines if you need to Drink plenty of fluids It is important that you quarantine at home until you get your coronavirus test results     Person Under Monitoring Name: Justin Dougherty  Location: [address] [place] [id number]   Infection Prevention Recommendations for Individuals Confirmed to have, or Being Evaluated for, 2019 Novel Coronavirus (COVID-19) Infection Who Receive Care at Home  Individuals who are confirmed to have, or are being evaluated for, COVID-19 should follow the prevention steps below until a healthcare provider or local or state health department says they can return to normal activities.  Stay home except to get medical care You should restrict activities outside your home, except for getting medical care. Do not go to work, school, or public areas, and do not use public transportation or taxis.  Call ahead before visiting your doctor Before your medical appointment, call the healthcare provider and tell them that you have, or are being evaluated for, COVID-19 infection. This will help the healthcare provider's office take steps to keep other people from getting infected. Ask your healthcare provider to call the local or state health department.  Monitor your symptoms Seek prompt medical attention if your illness is worsening (e.g., difficulty breathing). Before going to your medical appointment, call the healthcare provider and tell them that  you have, or are being evaluated for, COVID-19 infection. Ask your healthcare provider to call the local or state health department.  Wear a facemask You should wear a facemask that covers your nose and mouth when you are in the same room with other people and when you visit a healthcare provider. People who live with or visit you should also wear a facemask while they are in the same room with you.  Separate yourself from other people in your home As much as possible, you should stay in  a different room from other people in your home. Also, you should use a separate bathroom, if available.  Avoid sharing household items You should not share dishes, drinking glasses, cups, eating utensils, towels, bedding, or other items with other people in your home. After using these items, you should wash them thoroughly with soap and water.  Cover your coughs and sneezes Cover your mouth and nose with a tissue when you cough or sneeze, or you can cough or sneeze into your sleeve. Throw used tissues in a lined trash can, and immediately wash your hands with soap and water for at least 20 seconds or use an alcohol-based hand rub.  Wash your [employer] Wash your hands often and thoroughly with soap and water for at least 20 seconds. You can use an alcohol-based hand sanitizer if soap and water are not available and if your hands are not visibly dirty. Avoid touching your eyes, nose, and mouth with unwashed hands.   Prevention Steps for Caregivers and Household Members of Individuals Confirmed to have, or Being Evaluated for, COVID-19 Infection Being Cared for in the Home  If you live with, or provide care at home for, a person confirmed to have, or being evaluated for, COVID-19 infection please follow these guidelines to prevent infection:  Follow healthcare provider's instructions Make sure that you understand and can help the patient follow any healthcare provider instructions for all care.   Provide for the patient's basic needs You should help the patient with basic needs in the home and provide support for getting groceries, prescriptions, and other personal needs.  Monitor the patient's symptoms If they are getting sicker, call his or her medical provider and tell them that the patient has, or is being evaluated for, COVID-19 infection. This will help the healthcare provider's office take steps to keep other people from getting infected. Ask the healthcare provider to call the local or state health department.  Limit the number of people who have contact with the patient  If possible, have only one caregiver for the patient.  Other household members should stay in another home or place of residence. If this is not possible, they should stay  in another room, or be separated from the patient as much as possible. Use a separate bathroom, if available.  Restrict visitors who do not have an essential need to be in the home.  Keep older adults, very young children, and other sick people away from the patient Keep older adults, very young children, and those who have compromised immune systems or chronic health conditions away from the patient. This includes people with chronic heart, lung, or kidney conditions, diabetes, and cancer.  Ensure good ventilation Make sure that shared spaces in the home have good air flow, such as from an air conditioner or an opened window, weather permitting.  Wash your hands often  Wash your hands often and thoroughly with soap and water for at least 20 seconds. You can use an alcohol based hand sanitizer if soap and water are not available and if your hands are not visibly dirty.  Avoid touching your eyes, nose, and mouth with unwashed hands.  Use disposable paper towels to dry your hands. If not available, use dedicated cloth towels and replace them when they become wet.  Wear a facemask and gloves  Wear a disposable facemask at all  times in the room and gloves when you touch or have contact with the patient's blood, body fluids, and/or secretions or excretions, such  as sweat, saliva, sputum, nasal mucus, vomit, urine, or feces.  Ensure the mask fits over your nose and mouth tightly, and do not touch it during use.  Throw out disposable facemasks and gloves after using them. Do not reuse.  Wash your hands immediately after removing your facemask and gloves.  If your personal clothing becomes contaminated, carefully remove clothing and launder. Wash your hands after handling contaminated clothing.  Place all used disposable facemasks, gloves, and other waste in a lined container before disposing them with other household waste.  Remove gloves and wash your hands immediately after handling these items.  Do not share dishes, glasses, or other household items with the patient  Avoid sharing household items. You should not share dishes, drinking glasses, cups, eating utensils, towels, bedding, or other items with a patient who is confirmed to have, or being evaluated for, COVID-19 infection.  After the person uses these items, you should wash them thoroughly with soap and water.  Wash laundry thoroughly  Immediately remove and wash clothes or bedding that have blood, body fluids, and/or secretions or excretions, such as sweat, saliva, sputum, nasal mucus, vomit, urine, or feces, on them.  Wear gloves when handling laundry from the patient.  Read and follow directions on labels of laundry or clothing items and detergent. In general, wash and dry with the warmest temperatures recommended on the label.  Clean all areas the individual has used often  Clean all touchable surfaces, such as counters, tabletops, doorknobs, bathroom fixtures, toilets, phones, keyboards, tablets, and bedside tables, every day. Also, clean any surfaces that may have blood, body fluids, and/or secretions or excretions on them.  Wear gloves when  cleaning surfaces the patient has come in contact with.  Use a diluted bleach solution (e.g., dilute bleach with 1 part bleach and 10 parts water) or a household disinfectant with a label that says EPA-registered for coronaviruses. To make a bleach solution at home, add 1 tablespoon of bleach to 1 quart (4 cups) of water. For a larger supply, add  cup of bleach to 1 gallon (16 cups) of water.  Read labels of cleaning products and follow recommendations provided on product labels. Labels contain instructions for safe and effective use of the cleaning product including precautions you should take when applying the product, such as wearing gloves or eye protection and making sure you have good ventilation during use of the product.  Remove gloves and wash hands immediately after cleaning.  Monitor yourself for signs and symptoms of illness Caregivers and household members are considered close contacts, should monitor their health, and will be asked to limit movement outside of the home to the extent possible. Follow the monitoring steps for close contacts listed on the symptom monitoring form.   ? If you have additional questions, contact your local health department or call the epidemiologist on call at [phone] (available 24/7). ? This guidance is subject to change. For the most up-to-date guidance from [facility], please refer to their website: [email or web address]    ED Prescriptions    None     Controlled Substance Prescriptions [place] Controlled Substance Registry consulted? Not Applicable   Raylene Everts, MD 03/26/19 1651

## 2019-03-26 NOTE — ED Triage Notes (Signed)
Pt present recently exposed to covid-19 from a coworker. At this time pt denies any symptoms just needs to be tested so he can go back to work.

## 2019-03-26 NOTE — Discharge Instructions (Addendum)
Take Tylenol for any pain and fever Take over-the-counter cold medicines if you need to Drink plenty of fluids It is important that you quarantine at home until you get your coronavirus test results     Person Under Monitoring Name: Justin Dougherty  Location: [place] [address]   Infection Prevention Recommendations for Individuals Confirmed to have, or Being Evaluated for, 2019 Novel Coronavirus (COVID-19) Infection Who Receive Care at Home  Individuals who are confirmed to have, or are being evaluated for, COVID-19 should follow the prevention steps below until a healthcare provider or local or state health department says they can return to normal activities.  Stay home except to get medical care You should restrict activities outside your home, except for getting medical care. Do not go to work, school, or public areas, and do not use public transportation or taxis.  Call ahead before visiting your doctor Before your medical appointment, call the healthcare provider and tell them that you have, or are being evaluated for, COVID-19 infection. This will help the healthcare providers office take steps to keep other people from getting infected. Ask your healthcare provider to call the local or state health department.  Monitor your symptoms Seek prompt medical attention if your illness is worsening (e.g., difficulty breathing). Before going to your medical appointment, call the healthcare provider and tell them that you have, or are being evaluated for, COVID-19 infection. Ask your healthcare provider to call the local or state health department.  Wear a facemask You should wear a facemask that covers your nose and mouth when you are in the same room with other people and when you visit a healthcare provider. People who live with or visit you should also wear a facemask while they are in the same room with you.  Separate yourself from other people in  your home As much as possible, you should stay in a different room from other people in your home. Also, you should use a separate bathroom, if available.  Avoid sharing household items You should not share dishes, drinking glasses, cups, eating utensils, towels, bedding, or other items with other people in your home. After using these items, you should wash them thoroughly with soap and water.  Cover your coughs and sneezes Cover your mouth and nose with a tissue when you cough or sneeze, or you can cough or sneeze into your sleeve. Throw used tissues in a lined trash can, and immediately wash your hands with soap and water for at least 20 seconds or use an alcohol-based hand rub.  Wash your [employer] your hands often and thoroughly with soap and water for at least 20 seconds. You can use an alcohol-based hand sanitizer if soap and water are not available and if your hands are not visibly dirty. Avoid touching your eyes, nose, and mouth with unwashed hands.   Prevention Steps for Caregivers and Household Members of Individuals Confirmed to have, or Being Evaluated for, COVID-19 Infection Being Cared for in the Home  If you live with, or provide care at home for, a person confirmed to have, or being evaluated for, COVID-19 infection please follow these guidelines to prevent infection:  Follow healthcare providers instructions Make sure that you understand and can help the patient follow any healthcare provider instructions for all care.  Provide for the patients basic needs You should help the patient with basic needs in the home and provide support for getting groceries, prescriptions, and other personal needs.  Monitor the patients symptoms If they are getting sicker, call his or her medical provider and tell them that the patient has, or is being evaluated for, COVID-19 infection. This will help the healthcare providers office take steps to keep other people from getting  infected. Ask the healthcare provider to call the local or state health department.  Limit the number of people who have contact with the patient If possible, have only one caregiver for the patient. Other household members should stay in another home or place of residence. If this is not possible, they should stay in another room, or be separated from the patient as much as possible. Use a separate bathroom, if available. Restrict visitors who do not have an essential need to be in the home.  Keep older adults, very young children, and other sick people away from the patient Keep older adults, very young children, and those who have compromised immune systems or chronic health conditions away from the patient. This includes people with chronic heart, lung, or kidney conditions, diabetes, and cancer.  Ensure good ventilation Make sure that shared spaces in the home have good air flow, such as from an air conditioner or an opened window, weather permitting.  Wash your hands often Wash your hands often and thoroughly with soap and water for at least 20 seconds. You can use an alcohol based hand sanitizer if soap and water are not available and if your hands are not visibly dirty. Avoid touching your eyes, nose, and mouth with unwashed hands. Use disposable paper towels to dry your hands. If not available, use dedicated cloth towels and replace them when they become wet.  Wear a facemask and gloves Wear a disposable facemask at all times in the room and gloves when you touch or have contact with the patients blood, body fluids, and/or secretions or excretions, such as sweat, saliva, sputum, nasal mucus, vomit, urine, or feces.  Ensure the mask fits over your nose and mouth tightly, and do not touch it during use. Throw out disposable facemasks and gloves after using them. Do not reuse. Wash your hands immediately after removing your facemask and gloves. If your personal clothing becomes  contaminated, carefully remove clothing and launder. Wash your hands after handling contaminated clothing. Place all used disposable facemasks, gloves, and other waste in a lined container before disposing them with other household waste. Remove gloves and wash your hands immediately after handling these items.  Do not share dishes, glasses, or other household items with the patient Avoid sharing household items. You should not share dishes, drinking glasses, cups, eating utensils, towels, bedding, or other items with a patient who is confirmed to have, or being evaluated for, COVID-19 infection. After the person uses these items, you should wash them thoroughly with soap and water.  Wash laundry thoroughly Immediately remove and wash clothes or bedding that have blood, body fluids, and/or secretions or excretions, such as sweat, saliva, sputum, nasal mucus, vomit, urine, or feces, on them. Wear gloves when handling laundry from the patient. Read and follow directions on labels of laundry or clothing items and detergent. In general, wash and dry with the warmest temperatures recommended on the label.  Clean all areas the individual has used often Clean all touchable surfaces, such as counters, tabletops, doorknobs, bathroom fixtures, toilets, phones, keyboards, tablets, and bedside tables, every day. Also, clean any surfaces that may have blood, body fluids, and/or secretions or excretions on them. Wear gloves when cleaning surfaces the patient has  come in contact with. Use a diluted bleach solution (e.g., dilute bleach with 1 part bleach and 10 parts water) or a household disinfectant with a label that says EPA-registered for coronaviruses. To make a bleach solution at home, add 1 tablespoon of bleach to 1 quart (4 cups) of water. For a larger supply, add  cup of bleach to 1 gallon (16 cups) of water. Read labels of cleaning products and follow recommendations provided on product labels. Labels  contain instructions for safe and effective use of the cleaning product including precautions you should take when applying the product, such as wearing gloves or eye protection and making sure you have good ventilation during use of the product. Remove gloves and wash hands immediately after cleaning.  Monitor yourself for signs and symptoms of illness Caregivers and household members are considered close contacts, should monitor their health, and will be asked to limit movement outside of the home to the extent possible. Follow the monitoring steps for close contacts listed on the symptom monitoring form.   ? If you have additional questions, contact your local health department or call the epidemiologist on call at [phone] (available 24/7). ? This guidance is subject to change. For the most up-to-date guidance from [facility], please refer to their website: [email or web address]

## 2019-03-28 LAB — NOVEL CORONAVIRUS, NAA (HOSP ORDER, SEND-OUT TO REF LAB; TAT 18-24 HRS): SARS-CoV-2, NAA: NOT DETECTED

## 2019-03-29 ENCOUNTER — Encounter (HOSPITAL_COMMUNITY): Payer: Self-pay

## 2019-04-24 ENCOUNTER — Ambulatory Visit (HOSPITAL_COMMUNITY)
Admission: EM | Admit: 2019-04-24 | Discharge: 2019-04-24 | Disposition: A | Discharge status: Home / Self Care | Payer: BC Managed Care – PPO | Attending: Family Medicine | Admitting: Family Medicine

## 2019-04-24 ENCOUNTER — Other Ambulatory Visit: Payer: Self-pay

## 2019-04-24 ENCOUNTER — Encounter (HOSPITAL_COMMUNITY): Payer: Self-pay

## 2019-04-24 DIAGNOSIS — Z20828 Contact with and (suspected) exposure to other viral communicable diseases: Secondary | ICD-10-CM

## 2019-04-24 DIAGNOSIS — Z20822 Contact with and (suspected) exposure to covid-19: Secondary | ICD-10-CM

## 2019-04-24 NOTE — ED Triage Notes (Signed)
Pt Grandmother tested positive for Covid-19 and patient would like to be tested. Pt denies any symptoms

## 2019-04-24 NOTE — Discharge Instructions (Addendum)
Swab sent for testing.  Labs pending.  You can check my chart for results.

## 2019-04-25 LAB — NOVEL CORONAVIRUS, NAA (HOSP ORDER, SEND-OUT TO REF LAB; TAT 18-24 HRS): SARS-CoV-2, NAA: NOT DETECTED

## 2019-04-25 NOTE — ED Provider Notes (Signed)
[facility]    CSN: [id number] Arrival date & time: 04/24/19  1252      History   Chief Complaint Chief Complaint  Patient presents with  . Exposed to Covid 19    HPI Justin Dougherty is a 28 y.o. male.   Patient is a 28 year old male the presents today for COVID testing.  Reporting that he was exposed by his grandmother and aunt.  He is currently denying any symptoms.  Needs to be tested for his job.  Tested back in August with negative results.     History reviewed. No pertinent past medical history.  There are no active problems to display for this patient.   History reviewed. No pertinent surgical history.     Home Medications    Prior to Admission medications   Medication Sig Start Date End Date Taking? Authorizing Provider  doxycycline (VIBRAMYCIN) 100 MG capsule Take 1 capsule (100 mg total) by mouth 2 (two) times daily. 06/12/18   Rennis HardingWurst, Brittany, PA-C    Family History History reviewed. No pertinent family history.  Social History Social History   Tobacco Use  . Smoking status: Never Smoker  . Smokeless tobacco: Never Used  Substance Use Topics  . Alcohol use: No  . Drug use: No     Allergies   Patient has no known allergies.   Review of Systems Review of Systems  Constitutional: Negative.   HENT: Negative.   Respiratory: Negative.   Cardiovascular: Negative.   Gastrointestinal: Negative.   Genitourinary: Negative.   Musculoskeletal: Negative.   Neurological: Negative.      Physical Exam Triage Vital Signs ED Triage Vitals  Enc Vitals Group     BP 04/24/19 1335 (!) 117/51     Pulse Rate 04/24/19 1335 65     Resp 04/24/19 1335 18     Temp 04/24/19 1335 98.6 F (37 C)     Temp Source 04/24/19 1335 Skin     SpO2 04/24/19 1335 99 %     Weight --      Height --      Head Circumference --      Peak Flow --      Pain Score 04/24/19 1347 0     Pain Loc --      Pain Edu? --      Excl. in GC? --    No data found.   Updated Vital Signs BP (!) 117/51 (BP Location: Right Arm)   Pulse 65   Temp 98.6 F (37 C) (Skin)   Resp 18   SpO2 99%   Visual Acuity Right Eye Distance:   Left Eye Distance:   Bilateral Distance:    Right Eye Near:   Left Eye Near:    Bilateral Near:     Physical Exam Vitals signs and nursing note reviewed.  Constitutional:      Appearance: Normal appearance.  HENT:     Head: Normocephalic and atraumatic.     Nose: Nose normal.  Eyes:     Conjunctiva/sclera: Conjunctivae normal.  Neck:     Musculoskeletal: Normal range of motion.  Pulmonary:     Effort: Pulmonary effort is normal.  Musculoskeletal: Normal range of motion.  Skin:    General: Skin is warm and dry.  Neurological:     Mental Status: He is alert.  Psychiatric:        Mood and Affect: Mood normal.      UC Treatments / Results  Labs (all  labs ordered are listed, but only abnormal results are displayed) Labs Reviewed  NOVEL CORONAVIRUS, NAA (HOSP ORDER, SEND-OUT TO REF LAB; TAT 18-24 HRS)    EKG   Radiology No results found.  Procedures Procedures (including critical care time)  Medications Ordered in UC Medications - No data to display  Initial Impression / Assessment and Plan / UC Course  I have reviewed the triage vital signs and the nursing notes.  Pertinent labs & imaging results that were available during my care of the patient were reviewed by me and considered in my medical decision making (see chart for details).     COVID testing done with labs pending Final Clinical Impressions(s) / UC Diagnoses   Final diagnoses:  Exposure to Covid-19 Virus     Discharge Instructions     Swab sent for testing.  Labs pending.  You can check my chart for results.     ED Prescriptions    None     PDMP not reviewed this encounter.   Loura Halt A, NP 04/25/19 1018

## 2019-04-26 ENCOUNTER — Encounter (HOSPITAL_COMMUNITY): Payer: Self-pay

## 2019-07-08 ENCOUNTER — Other Ambulatory Visit: Payer: Self-pay

## 2019-07-08 DIAGNOSIS — Z20822 Contact with and (suspected) exposure to covid-19: Secondary | ICD-10-CM

## 2019-07-12 LAB — NOVEL CORONAVIRUS, NAA: SARS-CoV-2, NAA: NOT DETECTED

## 2019-09-12 ENCOUNTER — Emergency Department (HOSPITAL_COMMUNITY)
Admission: EM | Admit: 2019-09-12 | Discharge: 2019-09-12 | Disposition: A | Discharge status: Home / Self Care | Payer: BC Managed Care – PPO | Attending: Emergency Medicine | Admitting: Emergency Medicine

## 2019-09-12 ENCOUNTER — Encounter (HOSPITAL_COMMUNITY): Payer: Self-pay | Admitting: Emergency Medicine

## 2019-09-12 ENCOUNTER — Other Ambulatory Visit: Payer: Self-pay

## 2019-09-12 ENCOUNTER — Emergency Department (HOSPITAL_COMMUNITY): Payer: BC Managed Care – PPO

## 2019-09-12 DIAGNOSIS — R519 Headache, unspecified: Secondary | ICD-10-CM | POA: Diagnosis present

## 2019-09-12 DIAGNOSIS — G44319 Acute post-traumatic headache, not intractable: Secondary | ICD-10-CM | POA: Insufficient documentation

## 2019-09-12 MED ORDER — BUTALBITAL-APAP-CAFFEINE 50-325-40 MG PO TABS
1.0000 | ORAL_TABLET | Freq: Once | ORAL | Status: AC
  Administered 2019-09-12: 1 via ORAL
  Filled 2019-09-12: qty 1

## 2019-09-12 NOTE — ED Notes (Signed)
Patient verbalizes understanding of discharge instructions. Opportunity for questioning and answers were provided. Armband removed by staff, pt discharged from ED.  
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
## 2019-09-12 NOTE — Discharge Instructions (Addendum)
Follow up with Post concussion doctor
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
## 2019-09-13 ENCOUNTER — Telehealth: Payer: Self-pay

## 2019-09-13 NOTE — Telephone Encounter (Signed)
Left message for patient to call back to schedule in Concussion Clinic.  

## 2019-09-24 ENCOUNTER — Telehealth: Payer: Self-pay
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
## 2019-09-24 NOTE — Telephone Encounter (Signed)
Left message for patient to call back to be schedule in concussion clinic.

## 2019-09-27 NOTE — Telephone Encounter (Signed)
Left message for patient to call back to schedule in Concussion Clinic.  

## 2019-09-27 NOTE — Telephone Encounter (Signed)
Patient called back. He can be reached at [phone].

## 2019-09-28 NOTE — Telephone Encounter (Signed)
[facility] scheduled patient next week with Dr. Denyse Amass.

## 2019-10-05 ENCOUNTER — Ambulatory Visit: Payer: BC Managed Care – PPO | Admitting: Family Medicine

## 2019-10-05 DIAGNOSIS — Z0289 Encounter for other administrative examinations: Secondary | ICD-10-CM
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
## 2021-03-19 ENCOUNTER — Emergency Department (HOSPITAL_COMMUNITY)
Admission: EM | Admit: 2021-03-19 | Discharge: 2021-03-19 | Disposition: A | Discharge status: Home / Self Care | Payer: Self-pay | Attending: Emergency Medicine | Admitting: Emergency Medicine

## 2021-03-19 ENCOUNTER — Emergency Department (HOSPITAL_COMMUNITY): Payer: Self-pay

## 2021-03-19 ENCOUNTER — Other Ambulatory Visit: Payer: Self-pay

## 2021-03-19 DIAGNOSIS — Z5321 Procedure and treatment not carried out due to patient leaving prior to being seen by health care provider: Secondary | ICD-10-CM | POA: Insufficient documentation

## 2021-03-19 DIAGNOSIS — M7918 Myalgia, other site: Secondary | ICD-10-CM

## 2021-03-19 DIAGNOSIS — M542 Cervicalgia: Secondary | ICD-10-CM | POA: Insufficient documentation

## 2021-03-19 DIAGNOSIS — M545 Low back pain, unspecified: Secondary | ICD-10-CM | POA: Insufficient documentation

## 2021-03-19 DIAGNOSIS — Y92410 Unspecified street and highway as the place of occurrence of the external cause: Secondary | ICD-10-CM | POA: Insufficient documentation

## 2021-03-19 DIAGNOSIS — R519 Headache, unspecified: Secondary | ICD-10-CM | POA: Insufficient documentation

## 2021-03-19 MED ORDER — NAPROXEN 500 MG PO TABS: 500.0000 mg | ORAL_TABLET | Freq: Two times a day (BID) | ORAL | 0 refills | Status: AC

## 2021-03-19 MED ORDER — METHOCARBAMOL 500 MG PO TABS: 500.0000 mg | ORAL_TABLET | Freq: Two times a day (BID) | ORAL | 0 refills | Status: AC

## 2021-03-19 NOTE — ED Notes (Signed)
Pt called for bed x3 with no response. 
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
## 2021-03-19 NOTE — Discharge Instructions (Addendum)

## 2021-07-14 ENCOUNTER — Emergency Department (HOSPITAL_COMMUNITY)
Admission: EM | Admit: 2021-07-14 | Discharge: 2021-07-14 | Discharge status: Against Medical Advice | Payer: Self-pay | Attending: Emergency Medicine | Admitting: Emergency Medicine

## 2021-07-14 ENCOUNTER — Other Ambulatory Visit: Payer: Self-pay

## 2021-07-14 ENCOUNTER — Encounter (HOSPITAL_COMMUNITY): Payer: Self-pay | Admitting: Emergency Medicine

## 2021-07-14 DIAGNOSIS — Z5321 Procedure and treatment not carried out due to patient leaving prior to being seen by health care provider: Secondary | ICD-10-CM | POA: Insufficient documentation

## 2021-07-14 DIAGNOSIS — R059 Cough, unspecified: Secondary | ICD-10-CM | POA: Insufficient documentation

## 2021-07-14 DIAGNOSIS — R5381 Other malaise: Secondary | ICD-10-CM | POA: Insufficient documentation

## 2021-07-14 DIAGNOSIS — R197 Diarrhea, unspecified: Secondary | ICD-10-CM | POA: Insufficient documentation

## 2021-07-14 DIAGNOSIS — R0602 Shortness of breath: Secondary | ICD-10-CM | POA: Insufficient documentation

## 2021-07-14 DIAGNOSIS — Z20822 Contact with and (suspected) exposure to covid-19: Secondary | ICD-10-CM | POA: Insufficient documentation

## 2021-07-14 DIAGNOSIS — R109 Unspecified abdominal pain: Secondary | ICD-10-CM | POA: Insufficient documentation

## 2021-07-14 DIAGNOSIS — R079 Chest pain, unspecified: Secondary | ICD-10-CM | POA: Insufficient documentation

## 2021-07-14 LAB — RESP PANEL BY RT-PCR (FLU A&B, COVID) ARPGX2
Influenza A by PCR: NEGATIVE
Influenza B by PCR: NEGATIVE
SARS Coronavirus 2 by RT PCR: NEGATIVE

## 2021-07-14 MED ORDER — ACETAMINOPHEN 325 MG PO TABS
650.0000 mg | ORAL_TABLET | Freq: Once | ORAL | Status: AC
  Administered 2021-07-14: 650 mg via ORAL
  Filled 2021-07-14: qty 2

## 2021-07-14 NOTE — ED Provider Notes (Cosign Needed)
Emergency Medicine Provider Triage Evaluation Note  CHIMA ASTORINO , a 30 y.o. male  was evaluated in triage.  Pt complains of cough and shortness of breath Review of Systems  Positive: Cough, body aches Negative: congestion  Physical Exam  BP (!) 120/107 (BP Location: Left Arm)   Pulse (!) 45   Temp 97.8 F (36.6 C) (Oral)   Resp 16   Ht 5\' 9"  (1.753 m)   Wt 70 kg   SpO2 100%   BMI 22.79 kg/m  Gen:   Awake, no distress   Resp:  Normal effort  MSK:   Moves extremities without difficulty  Other:    Medical Decision Making  Medically screening exam initiated at 2:27 PM.  Appropriate orders placed.  was informed that the remainder of the evaluation will be completed by another provider, this initial triage assessment does not replace that evaluation, and the importance of remaining in the ED until their evaluation is complete.     Lytle Butte, [place]son [place] 07/14/21 1429

## 2021-07-14 NOTE — ED Notes (Signed)
Patient was called to recheck vital signs but no response. x1 

## 2021-07-14 NOTE — ED Notes (Signed)
Patient was called to recheck vital signs but no response. x2 

## 2021-07-14 NOTE — ED Triage Notes (Signed)
Patient complains of chest pain and abdominal pain since last night, feeling hot and cold, diarrhea and general malaise.
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
## 2023-12-23 ENCOUNTER — Other Ambulatory Visit: Payer: Self-pay

## 2023-12-23 ENCOUNTER — Encounter: Payer: Self-pay | Admitting: Emergency Medicine

## 2023-12-23 ENCOUNTER — Encounter (HOSPITAL_COMMUNITY): Payer: Self-pay

## 2023-12-23 ENCOUNTER — Emergency Department (HOSPITAL_COMMUNITY)
Admission: EM | Admit: 2023-12-23 | Discharge: 2023-12-23 | Disposition: A | Discharge status: Home / Self Care | Payer: Self-pay | Attending: Emergency Medicine | Admitting: Emergency Medicine

## 2023-12-23 ENCOUNTER — Ambulatory Visit
Admission: EM | Admit: 2023-12-23 | Discharge: 2023-12-23 | Disposition: A | Discharge status: Home / Self Care | Payer: Self-pay | Attending: Internal Medicine | Admitting: Internal Medicine

## 2023-12-23 DIAGNOSIS — R1013 Epigastric pain: Secondary | ICD-10-CM | POA: Insufficient documentation

## 2023-12-23 DIAGNOSIS — R112 Nausea with vomiting, unspecified: Secondary | ICD-10-CM

## 2023-12-23 DIAGNOSIS — F1210 Cannabis abuse, uncomplicated: Secondary | ICD-10-CM | POA: Insufficient documentation

## 2023-12-23 DIAGNOSIS — R1110 Vomiting, unspecified: Secondary | ICD-10-CM | POA: Insufficient documentation

## 2023-12-23 LAB — COMPREHENSIVE METABOLIC PANEL WITH GFR
ALT: 16 U/L (ref 0–44)
AST: 28 U/L (ref 15–41)
Albumin: 4.6 g/dL (ref 3.5–5.0)
Alkaline Phosphatase: 80 U/L (ref 38–126)
Anion gap: 10 (ref 5–15)
BUN: 12 mg/dL (ref 6–20)
CO2: 22 mmol/L (ref 22–32)
Calcium: 9.9 mg/dL (ref 8.9–10.3)
Chloride: 107 mmol/L (ref 98–111)
Creatinine, Ser: 1.02 mg/dL (ref 0.61–1.24)
GFR, Estimated: >60 mL/min (ref >60)
Glucose, Bld: 113 mg/dL — ABNORMAL HIGH (ref 70–99)
Potassium: 3.7 mmol/L (ref 3.5–5.1)
Sodium: 139 mmol/L (ref 135–145)
Total Bilirubin: 0.7 mg/dL (ref 0.0–1.2)
Total Protein: 7.9 g/dL (ref 6.5–8.1)

## 2023-12-23 LAB — CBC
HCT: 43.2 % (ref 39.0–52.0)
Hemoglobin: 14.1 g/dL (ref 13.0–17.0)
MCH: 29.2 pg (ref 26.0–34.0)
MCHC: 32.6 g/dL (ref 30.0–36.0)
MCV: 89.4 fL (ref 80.0–100.0)
Platelets: 233 10*3/uL (ref 150–400)
RBC: 4.83 MIL/uL (ref 4.22–5.81)
RDW: 12.5 % (ref 11.5–15.5)
WBC: 8.2 10*3/uL (ref 4.0–10.5)
nRBC: 0 % (ref 0.0–0.2)

## 2023-12-23 LAB — LIPASE, BLOOD: Lipase: 28 U/L (ref 11–51)

## 2023-12-23 MED ORDER — SODIUM CHLORIDE 0.9 % IV SOLN
INTRAVENOUS | Status: DC
  Administered 2023-12-23: 1000 mL via INTRAVENOUS

## 2023-12-23 MED ORDER — DROPERIDOL 2.5 MG/ML IJ SOLN
1.2500 mg | Freq: Once | INTRAMUSCULAR | Status: AC
  Administered 2023-12-23: 1.25 mg via INTRAVENOUS
  Filled 2023-12-23: qty 2

## 2023-12-23 MED ORDER — ONDANSETRON HCL 4 MG/2ML IJ SOLN
4.0000 mg | Freq: Once | INTRAMUSCULAR | Status: AC
  Administered 2023-12-23: 4 mg via INTRAMUSCULAR

## 2023-12-23 MED ORDER — FENTANYL CITRATE PF 50 MCG/ML IJ SOSY
50.0000 ug | PREFILLED_SYRINGE | Freq: Once | INTRAMUSCULAR | Status: AC
  Administered 2023-12-23: 50 ug via INTRAVENOUS
  Filled 2023-12-23: qty 1

## 2023-12-23 MED ORDER — SODIUM CHLORIDE 0.9 % IV BOLUS
1000.0000 mL | Freq: Once | INTRAVENOUS | Status: AC
  Administered 2023-12-23: 1000 mL via INTRAVENOUS

## 2023-12-23 MED ORDER — MORPHINE SULFATE (PF) 4 MG/ML IV SOLN: 6.0000 mg | Freq: Once | INTRAVENOUS | Status: DC

## 2023-12-23 MED ORDER — LORAZEPAM 2 MG/ML IJ SOLN
0.5000 mg | Freq: Once | INTRAMUSCULAR | Status: AC
  Administered 2023-12-23: 0.5 mg via INTRAVENOUS
  Filled 2023-12-23: qty 1

## 2023-12-23 NOTE — ED Triage Notes (Signed)
 Pt c/o abdominal pain, vomiting, and profuse sweating that began this morning

## 2023-12-23 NOTE — ED Notes (Signed)
 Patient is being discharged from the Urgent Care and sent to the Emergency Department via POV . Per Shella Devoid, FNP, patient is in need of higher level of care due to Abdominal pain. Patient is aware and verbalizes understanding of plan of care. There were no vitals filed for this visit.

## 2023-12-23 NOTE — ED Provider Notes (Signed)
 [facility]    CSN: [id number] Arrival date & time: 12/23/23  1349      History   Chief Complaint Chief Complaint  Patient presents with   Abdominal Pain   Excessive Sweating   Emesis    HPI YERIEL MINEO is a 33 y.o. male.   IWAO SHAMBLIN is a 32 y.o. male presenting for chief complaint of sudden onset nausea, vomiting, and sweating that started this morning. He has had one episode of non-bilious/non-bloody emesis today on the way to urgent care and is currently still nauseous.  He comes in complaining of epigastric abdominal pain without any other pain to the lower abdomen.  No history of surgeries to the abdomen including cholecystectomy/appendectomy.  Denies recent sick contacts with similar symptoms.  He denies chest pain, shortness of breath, heart palpitations, back pain, dizziness, ear pain, cough/congestion, syncope, and flank pain/urinary symptoms.  He admits to smoking marijuana last night and states that he smoked his normal amount.  Denies all other drug use including methamphetamine, cocaine, and cigarette use.  Denies alcohol intake.  Denies personal and family history of heart problems.  He has not attempted use of any over-the-counter medications to help with his symptoms prior to arrival.     History reviewed. No pertinent past medical history.  There are no active problems to display for this patient.   History reviewed. No pertinent surgical history.     Home Medications    Prior to Admission medications   Medication Sig Start Date End Date Taking? Authorizing Provider  doxycycline  (VIBRAMYCIN ) 100 MG capsule Take 1 capsule (100 mg total) by mouth 2 (two) times daily. Patient not taking: Reported on 09/12/2019 06/12/18   Lu Rump [place], PA-C  methocarbamol  (ROBAXIN ) 500 MG tablet Take 1 tablet (500 mg total) by mouth 2 (two) times daily. 03/19/21   Couture, Cortni S, PA-C    Family History History reviewed. No pertinent family  history.  Social History Social History   Tobacco Use   Smoking status: Never   Smokeless tobacco: Never  Substance Use Topics   Alcohol use: No   Drug use: Yes    Types: Marijuana     Allergies   Patient has no known allergies.   Review of Systems Review of Systems Per HPI  Physical Exam Triage Vital Signs ED Triage Vitals  Encounter Vitals Group     BP      Systolic BP Percentile      Diastolic BP Percentile      Pulse      Resp      Temp      Temp src      SpO2      Weight      Height      Head Circumference      Peak Flow      Pain Score      Pain Loc      Pain Education      Exclude from Growth Chart    No data found.  Updated Vital Signs There were no vitals taken for this visit.  Visual Acuity Right Eye Distance:   Left Eye Distance:   Bilateral Distance:    Right Eye Near:   Left Eye Near:    Bilateral Near:     Physical Exam Vitals and nursing note reviewed.  Constitutional:      Appearance: He is ill-appearing and diaphoretic. He is not toxic-appearing.  HENT:     Head:  Normocephalic and atraumatic.     Right Ear: Hearing, tympanic membrane, ear canal and external ear normal.     Left Ear: Hearing, tympanic membrane, ear canal and external ear normal.     Nose: Nose normal.     Mouth/Throat:     Lips: Pink.     Mouth: Mucous membranes are moist. No injury or oral lesions.     Dentition: Normal dentition.     Tongue: No lesions.     Pharynx: Oropharynx is clear. Uvula midline. No pharyngeal swelling, oropharyngeal exudate, posterior oropharyngeal erythema, uvula swelling or postnasal drip.     Tonsils: No tonsillar exudate.  Eyes:     General: Lids are normal. Vision grossly intact. Gaze aligned appropriately.     Extraocular Movements: Extraocular movements intact.     Conjunctiva/sclera: Conjunctivae normal.  Neck:     Trachea: Trachea and phonation normal.  Cardiovascular:     Rate and Rhythm: Normal rate and regular rhythm.      Heart sounds: Normal heart sounds, S1 normal and S2 normal.  Pulmonary:     Effort: Pulmonary effort is normal. No respiratory distress.     Breath sounds: Normal breath sounds and air entry. No wheezing, rhonchi or rales.  Chest:     Chest wall: No tenderness.  Abdominal:     General: Abdomen is flat. Bowel sounds are normal. There is no distension.     Palpations: Abdomen is soft.     Tenderness: There is no abdominal tenderness. There is no right CVA tenderness, left CVA tenderness, guarding or rebound. Negative signs include Murphy's sign, Rovsing's sign and McBurney's sign.     Hernia: No hernia is present.     Comments: Non-tender to palpation of the abdomen. No peritoneal signs.   Musculoskeletal:     Cervical back: Neck supple.  Lymphadenopathy:     Cervical: No cervical adenopathy.  Skin:    General: Skin is warm.     Capillary Refill: Capillary refill takes less than 2 seconds.     Findings: No rash.  Neurological:     General: No focal deficit present.     Mental Status: He is alert and oriented to person, place, and time. Mental status is at baseline.     Cranial Nerves: No dysarthria or facial asymmetry.  Psychiatric:        Mood and Affect: Mood normal.        Speech: Speech normal.        Behavior: Behavior normal.        Thought Content: Thought content normal.        Judgment: Judgment normal.      UC Treatments / Results  Labs (all labs ordered are listed, but only abnormal results are displayed) Labs Reviewed - No data to display  EKG   Radiology No results found.  Procedures Procedures (including critical care time)  Medications Ordered in UC Medications  ondansetron (ZOFRAN) injection 4 mg (4 mg Intramuscular Given 12/23/23 1403)    Initial Impression / Assessment and Plan / UC Course  I have reviewed the triage vital signs and the nursing notes.  Pertinent labs & imaging results that were available during my care of the patient were  reviewed by me and considered in my medical decision making (see chart for details).   1. Epigastric abdominal pain, nausea and vomiting Patient is ill-appearing and diaphoretic.  EKG obtained which shows sinus bradycardia without ST/T wave changes.  He has a history  of bradycardia at baseline.  He is neurologically intact to his baseline. No peritoneal signs on abdominal exam. Despite his ill appearance on exam and diaphoresis, his abdominal pain is not reproducible on exam. Zofran 4 mg IM given. After Zofran is given, he states he was "never nauseous but only threw up 1 time in the car". He continues to deny illicit drug use other than marijuana.  Although he is nontender on exam, he is very ill-appearing and I remain concerned for intra-abdominal abnormality.  He would benefit from further workup and evaluation in the emergency department with stat blood work and possible imaging.   Discussed clinical concerns/exam findings leading to recommendation for further workup in the ER setting and risks of deferring ER visit with patient/family. Patient/family express understanding and agreement with plan, discharged to ER via private car with mother.   Final Clinical Impressions(s) / UC Diagnoses   Final diagnoses:  Abdominal pain, epigastric  Nausea and vomiting, unspecified vomiting type     Discharge Instructions      Please go to the nearest ER for further workup and evaluation due to your abdominal pain.    ED Prescriptions   None    PDMP not reviewed this encounter.   Starlene Eaton, [place] 12/23/23 1443

## 2023-12-23 NOTE — ED Triage Notes (Addendum)
 Pt came in for abdominal pain, emesis and chills since this morning. Pt went to the UC and was given IM zofran. Pt presents with sweating and guarding his abdomen. Pt also states he's having a hard time breathing.

## 2023-12-23 NOTE — Discharge Instructions (Addendum)
 Please go to the nearest ER for further workup and evaluation due to your abdominal pain.

## 2023-12-23 NOTE — ED Provider Notes (Signed)
 [place] EMERGENCY DEPARTMENT AT [facility] Provider Note   CSN: [id number] Arrival date & time: 12/23/23  1444     History  Chief Complaint  Patient presents with   Abdominal Pain   Shortness of Breath   Chills    Justin Dougherty is a 33 y.o. male.  33 year old male presents with epigastric abdominal pain which began this morning.  Patient admits to smoking copious amounts of marijuana daily.  States he smokes at least 4 blunts at times.  States has not had any fever or chills.  His emesis is not bilious or bloody.  No black or bloody stools.  Went to urgent care center and was given Zofran and transported here.  Denies any prior abdominal surgical history       Home Medications Prior to Admission medications   Medication Sig Start Date End Date Taking? Authorizing Provider  doxycycline  (VIBRAMYCIN ) 100 MG capsule Take 1 capsule (100 mg total) by mouth 2 (two) times daily. Patient not taking: Reported on 09/12/2019 06/12/18   Lu Rump [place], PA-C  methocarbamol  (ROBAXIN ) 500 MG tablet Take 1 tablet (500 mg total) by mouth 2 (two) times daily. 03/19/21   Couture, Cortni S, PA-C      Allergies    Patient has no known allergies.    Review of Systems   Review of Systems  All other systems reviewed and are negative.   Physical Exam Updated Vital Signs BP (!) 161/77   Pulse 64   Temp (!) 97.2 F (36.2 C) (Oral)   Resp 20   SpO2 100%  Physical Exam Vitals and nursing note reviewed.  Constitutional:      General: He is not in acute distress.    Appearance: Normal appearance. He is well-developed. He is not toxic-appearing.  HENT:     Head: Normocephalic and atraumatic.  Eyes:     General: Lids are normal.     Conjunctiva/sclera: Conjunctivae normal.     Pupils: Pupils are equal, round, and reactive to light.  Neck:     Thyroid: No thyroid mass.     Trachea: No tracheal deviation.  Cardiovascular:     Rate and Rhythm: Normal rate and regular rhythm.      Heart sounds: Normal heart sounds. No murmur heard.    No gallop.  Pulmonary:     Effort: Pulmonary effort is normal. No respiratory distress.     Breath sounds: Normal breath sounds. No stridor. No decreased breath sounds, wheezing, rhonchi or rales.  Abdominal:     General: There is no distension.     Palpations: Abdomen is soft.     Tenderness: There is generalized abdominal tenderness. There is no guarding or rebound.  Musculoskeletal:        General: No tenderness. Normal range of motion.     Cervical back: Normal range of motion and neck supple.  Skin:    General: Skin is warm and dry.     Findings: No abrasion or rash.  Neurological:     Mental Status: He is alert and oriented to person, place, and time. Mental status is at baseline.     GCS: GCS eye subscore is 4. GCS verbal subscore is 5. GCS motor subscore is 6.     Cranial Nerves: No cranial nerve deficit.     Sensory: No sensory deficit.     Motor: Motor function is intact.  Psychiatric:        Attention and Perception: Attention normal.  Speech: Speech normal.        Behavior: Behavior normal.     ED Results / Procedures / Treatments   Labs (all labs ordered are listed, but only abnormal results are displayed) Labs Reviewed  LIPASE, BLOOD  COMPREHENSIVE METABOLIC PANEL WITH GFR  CBC  URINALYSIS, ROUTINE W REFLEX MICROSCOPIC    EKG None  Radiology No results found.  Procedures Procedures    Medications Ordered in ED Medications  0.9 %  sodium chloride infusion (has no administration in time range)  droperidol (INAPSINE) 2.5 MG/ML injection 1.25 mg (has no administration in time range)  LORazepam (ATIVAN) injection 0.5 mg (has no administration in time range)  fentaNYL (SUBLIMAZE) injection 50 mcg (has no administration in time range)  sodium chloride 0.9 % bolus 1,000 mL (1,000 mLs Intravenous New Bag/Given 12/23/23 1522)    ED Course/ Medical Decision Making/ A&P                                  Medical Decision Making Amount and/or Complexity of Data Reviewed Labs: ordered.  Risk Prescription drug management.   Patient's labs here are reassuring.  Suspect he has cannabinoid hyperemesis.  Treated with appropriate medications and feels better.  Repeat abdominal exam remains benign.  Will discharge home.  Patient encouraged to not smoke marijuana        Final Clinical Impression(s) / ED Diagnoses Final diagnoses:  None    Rx / DC Orders ED Discharge Orders     None         Lind Repine, MD 12/23/23 1745
